# Patient Record
Sex: Female | Born: 1965 | Race: White | Hispanic: No | Marital: Married | Smoking: Current every day smoker
Health system: Southern US, Community
[De-identification: ages and names within clinical notes are randomized; demographics above are authoritative.]

## PROBLEM LIST (undated history)

## (undated) DIAGNOSIS — I10 Essential (primary) hypertension: Secondary | ICD-10-CM

## (undated) HISTORY — PX: GALLBLADDER SURGERY: SHX652

## (undated) HISTORY — DX: Essential (primary) hypertension: I10

---

## 2012-10-27 ENCOUNTER — Ambulatory Visit: Payer: Self-pay | Admitting: Family

## 2013-01-30 ENCOUNTER — Ambulatory Visit: Payer: Self-pay | Admitting: Family

## 2014-08-21 ENCOUNTER — Encounter: Payer: Self-pay | Admitting: Obstetrics and Gynecology

## 2014-08-21 ENCOUNTER — Ambulatory Visit (INDEPENDENT_AMBULATORY_CARE_PROVIDER_SITE_OTHER): Payer: Managed Care, Other (non HMO) | Admitting: Obstetrics and Gynecology

## 2014-08-21 VITALS — BP 126/71 | HR 92 | Ht 64.0 in | Wt 145.3 lb

## 2014-08-21 DIAGNOSIS — Z01419 Encounter for gynecological examination (general) (routine) without abnormal findings: Secondary | ICD-10-CM

## 2014-08-21 DIAGNOSIS — Z720 Tobacco use: Secondary | ICD-10-CM | POA: Diagnosis not present

## 2014-08-21 DIAGNOSIS — N943 Premenstrual tension syndrome: Secondary | ICD-10-CM

## 2014-08-21 DIAGNOSIS — F3281 Premenstrual dysphoric disorder: Secondary | ICD-10-CM

## 2014-08-21 DIAGNOSIS — F17200 Nicotine dependence, unspecified, uncomplicated: Secondary | ICD-10-CM

## 2014-08-21 MED ORDER — FLUOXETINE HCL 10 MG PO CAPS: 10.0000 mg | ORAL_CAPSULE | Freq: Every day | ORAL | Status: DC

## 2014-08-21 MED ORDER — TRIAMCINOLONE ACETONIDE 0.025 % EX OINT: 1.0000 "application " | TOPICAL_OINTMENT | Freq: Two times a day (BID) | CUTANEOUS | Status: DC

## 2014-08-21 NOTE — Progress Notes (Signed)
  Subjective:     Sherri Alvarez is a 49 y.o. female and is here for a comprehensive physical exam. The patient reports spacing out of menses x 6 months, with moderate breast tenderness and severe aggitation the week before.Marland Kitchen  History   Social History  . Marital Status: Married    Spouse Name: N/A  . Number of Children: N/A  . Years of Education: N/A   Occupational History  . Not on file.   Social History Main Topics  . Smoking status: Current Every Day Smoker  . Smokeless tobacco: Never Used  . Alcohol Use: Yes     Comment: occas  . Drug Use: No  . Sexual Activity: Yes     Comment: husband-vasectomy   Other Topics Concern  . Not on file   Social History Narrative  . No narrative on file   Health Maintenance  Topic Date Due  . HIV Screening  10/30/1981  . PAP SMEAR  10/30/1984  . TETANUS/TDAP  10/30/1985  . INFLUENZA VACCINE  08/13/2014    The following portions of the patient's history were reviewed and updated as appropriate: allergies, current medications, past family history, past medical history, past social history, past surgical history and problem list.  Review of Systems A comprehensive review of systems was negative except for: Behavioral/Psych: positive for mood swings, sleep disturbance, tobacco use and premenstrual aggitation   Objective:    General appearance: alert, cooperative and appears stated age Neck: no adenopathy, no carotid bruit, no JVD, supple, symmetrical, trachea midline and thyroid not enlarged, symmetric, no tenderness/mass/nodules Lungs: clear to auscultation bilaterally Breasts: normal appearance, no masses or tenderness Heart: regular rate and rhythm, S1, S2 normal, no murmur, click, rub or gallop Abdomen: soft, non-tender; bowel sounds normal; no masses,  no organomegaly Pelvic: cervix normal in appearance, external genitalia normal, no adnexal masses or tenderness, no cervical motion tenderness, rectovaginal septum normal, uterus  normal size, shape, and consistency and vagina normal without discharge    Assessment:    Healthy female exam. PMDD, smoker- not motivated to quit. Eczema of mons pubis      Plan:  Routine pap and MMG ordered; screening labs ordered will return 08/31/14 for them prozac ordered to take week before menses rx sent in for triamcilone cream for prn use  RTC 1 year or as needed   See After Visit Summary for Counseling Recommendations

## 2014-08-23 ENCOUNTER — Telehealth: Payer: Self-pay | Admitting: Obstetrics and Gynecology

## 2014-08-23 ENCOUNTER — Other Ambulatory Visit: Payer: Self-pay | Admitting: *Deleted

## 2014-08-23 MED ORDER — TRIAMCINOLONE ACETONIDE 0.025 % EX OINT
1.0000 | TOPICAL_OINTMENT | Freq: Two times a day (BID) | CUTANEOUS | Status: DC
Start: 2014-08-23 — End: 2016-10-21

## 2014-08-23 MED ORDER — FLUOXETINE HCL 10 MG PO CAPS: 10.0000 mg | ORAL_CAPSULE | Freq: Every day | ORAL | Status: DC

## 2014-08-23 MED ORDER — LORATADINE-PSEUDOEPHEDRINE ER 5-120 MG PO TB12: 1.0000 | ORAL_TABLET | Freq: Every day | ORAL | Status: DC

## 2014-08-23 NOTE — Telephone Encounter (Signed)
rx did not go to [facility] she was here tuesday

## 2014-08-23 NOTE — Telephone Encounter (Signed)
Medication was sent to [facility] pt told me wrong

## 2014-08-24 ENCOUNTER — Encounter: Payer: Self-pay | Admitting: Obstetrics and Gynecology

## 2014-08-24 ENCOUNTER — Encounter: Payer: Self-pay | Admitting: *Deleted

## 2014-08-24 ENCOUNTER — Telehealth: Payer: Self-pay | Admitting: *Deleted

## 2014-08-24 NOTE — Telephone Encounter (Signed)
-----   Message from Ulyses Amor, [place] sent at 08/24/2014  3:46 PM EDT ----- Please let her know her pap and HPV were both negative

## 2014-08-24 NOTE — Telephone Encounter (Signed)
Notified pt of normal results 

## 2014-09-27 ENCOUNTER — Other Ambulatory Visit: Payer: Self-pay | Admitting: Obstetrics and Gynecology

## 2014-09-27 DIAGNOSIS — R928 Other abnormal and inconclusive findings on diagnostic imaging of breast: Secondary | ICD-10-CM

## 2014-10-29 ENCOUNTER — Ambulatory Visit
Admission: RE | Admit: 2014-10-29 | Discharge: 2014-10-29 | Disposition: A | Discharge status: Home / Self Care | Payer: Managed Care, Other (non HMO) | Source: Ambulatory Visit | Attending: Obstetrics and Gynecology | Admitting: Obstetrics and Gynecology

## 2014-10-29 ENCOUNTER — Other Ambulatory Visit: Payer: Self-pay | Admitting: Obstetrics and Gynecology

## 2014-10-29 DIAGNOSIS — R928 Other abnormal and inconclusive findings on diagnostic imaging of breast: Secondary | ICD-10-CM

## 2014-10-29 DIAGNOSIS — N63 Unspecified lump in breast: Secondary | ICD-10-CM | POA: Diagnosis present

## 2014-11-27 ENCOUNTER — Telehealth: Payer: Self-pay | Admitting: *Deleted

## 2014-11-27 NOTE — Telephone Encounter (Signed)
-----   Message from Ulyses AmorMelody N Burr, [place] sent at 10/30/2014  3:28 PM EDT ----- Please let her know I have reviewed her f/u MMG and u/s, and agree with findings and recommended f/u scan in 6 months. Have her notify us when she recieves letter stating it is time to schedule and i will put in the orders

## 2014-11-27 NOTE — Telephone Encounter (Signed)
Notified pt. 

## 2015-08-22 ENCOUNTER — Encounter: Payer: Managed Care, Other (non HMO) | Admitting: Obstetrics and Gynecology

## 2015-10-23 ENCOUNTER — Encounter: Payer: Managed Care, Other (non HMO) | Admitting: Obstetrics and Gynecology

## 2016-10-21 ENCOUNTER — Encounter: Payer: Self-pay | Admitting: Obstetrics and Gynecology

## 2016-10-21 ENCOUNTER — Ambulatory Visit (INDEPENDENT_AMBULATORY_CARE_PROVIDER_SITE_OTHER): Payer: 59 | Admitting: Obstetrics and Gynecology

## 2016-10-21 VITALS — BP 147/75 | HR 85 | Ht 63.0 in | Wt 156.2 lb

## 2016-10-21 DIAGNOSIS — Z01419 Encounter for gynecological examination (general) (routine) without abnormal findings: Secondary | ICD-10-CM

## 2016-10-21 NOTE — Addendum Note (Signed)
Addended by: Rosine Beat L on: 10/21/2016 04:05 PM   Modules accepted: Orders

## 2016-10-21 NOTE — Progress Notes (Signed)
Subjective:   Sherri Alvarez is a 51 y.o. G2P2 Caucasian female here for a routine well-woman exam.  No LMP recorded.    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 Illicit drugs: no history of illicit drug use  The following portions of the patient's history were reviewed and updated as appropriate: allergies, current medications, past family history, past medical history, past social history, past surgical history and problem list.  Past Medical History Past Medical History:  Diagnosis Date  . Hypertension     Past Surgical History Past Surgical History:  Procedure Laterality Date  . GALLBLADDER SURGERY      Gynecologic History G2P2  No LMP recorded. Contraception: vasectomy Last Pap: 2016. Results were: normal Last mammogram: 2016. Results were: abnormal, with stable f/u films in 6 months  Obstetric History OB History  Gravida Para Term Preterm AB Living  2 2          SAB TAB Ectopic Multiple Live Births               # Outcome Date GA Lbr Len/2nd Weight Sex Delivery Anes PTL Lv  2 Para 2003    F Vag-Spont     1 Para 2001    M Vag-Spont         Current Medications Current Outpatient Prescriptions on File Prior to Visit  Medication Sig Dispense Refill  . FLUoxetine (PROZAC) 10 MG capsule Take 1 capsule (10 mg total) by mouth daily. 30 capsule 3  . loratadine-pseudoephedrine (CLARITIN-D 12-HOUR) 5-120 MG per tablet Take 1 tablet by mouth daily. 30 tablet 3  . triamcinolone (KENALOG) 0.025 % ointment Apply 1 application topically 2 (two) times daily. 30 g 2   No current facility-administered medications on file prior to visit.     Review of Systems Patient denies any headaches, blurred vision, shortness of breath, chest pain, abdominal pain,  problems with bowel movements, urination, or intercourse.  Objective:  BP (!) 147/75   Pulse 85   Ht  (1.6 m)   Wt 156 lb 3.2 oz (70.9 kg)   BMI 27.67 kg/m  Physical Exam  General:  Well developed, well nourished, no acute distress. She is alert and oriented x3. Skin:  Warm and dry Neck:  Midline trachea, no thyromegaly or nodules Cardiovascular: Regular rate and rhythm, no murmur heard Lungs:  Effort normal, all lung fields clear to auscultation bilaterally Breasts:  No dominant palpable mass, retraction, or nipple discharge Abdomen:  Soft, non tender, no hepatosplenomegaly or masses Pelvic:  External genitalia is normal in appearance.  The vagina is normal in appearance. The cervix is bulbous, no CMT.  Thin prep pap is not done . Uterus is felt to be normal size, shape, and contour.  No adnexal masses or tenderness noted. Extremities:  No swelling or varicosities noted Psych:  She has a normal mood and affect  Assessment:   Healthy well-woman exam  Plan:   F/U 1 year for AE, or sooner if needed Mammogram ordered                   Colonoscopy declined  Melody Suzan Nailer, CNM

## 2017-10-28 ENCOUNTER — Encounter: Payer: 59 | Admitting: Obstetrics and Gynecology

## 2018-08-26 ENCOUNTER — Other Ambulatory Visit: Payer: Self-pay | Admitting: Internal Medicine

## 2018-08-26 DIAGNOSIS — N6320 Unspecified lump in the left breast, unspecified quadrant: Secondary | ICD-10-CM

## 2018-09-21 ENCOUNTER — Ambulatory Visit
Admission: RE | Admit: 2018-09-21 | Discharge: 2018-09-21 | Disposition: A | Discharge status: Home / Self Care | Payer: 59 | Source: Ambulatory Visit | Attending: Internal Medicine | Admitting: Internal Medicine

## 2018-09-21 ENCOUNTER — Encounter: Payer: Self-pay | Admitting: Radiology

## 2018-09-21 DIAGNOSIS — N6320 Unspecified lump in the left breast, unspecified quadrant: Secondary | ICD-10-CM | POA: Insufficient documentation

## 2019-06-09 ENCOUNTER — Other Ambulatory Visit: Payer: Self-pay | Admitting: Internal Medicine

## 2019-06-09 DIAGNOSIS — Z1231 Encounter for screening mammogram for malignant neoplasm of breast: Secondary | ICD-10-CM

## 2019-12-13 ENCOUNTER — Ambulatory Visit
Admission: RE | Admit: 2019-12-13 | Discharge: 2019-12-13 | Disposition: A | Discharge status: Home / Self Care | Payer: 59 | Source: Ambulatory Visit | Attending: Internal Medicine | Admitting: Internal Medicine

## 2019-12-13 ENCOUNTER — Other Ambulatory Visit: Payer: Self-pay

## 2019-12-13 DIAGNOSIS — Z1231 Encounter for screening mammogram for malignant neoplasm of breast: Secondary | ICD-10-CM | POA: Insufficient documentation

## 2021-06-18 ENCOUNTER — Other Ambulatory Visit: Payer: Self-pay | Admitting: Internal Medicine

## 2021-06-18 DIAGNOSIS — Z1231 Encounter for screening mammogram for malignant neoplasm of breast: Secondary | ICD-10-CM

## 2021-07-09 IMAGING — MG DIGITAL SCREENING BILAT W/ TOMO W/ CAD
6 of 10 series · 6 of 30 positions shown · non-contrast
Comparison: Previous exam(s).

CLINICAL DATA: Screening.

EXAM:
DIGITAL SCREENING BILATERAL MAMMOGRAM WITH TOMO AND CAD

[R CC synth-2D]
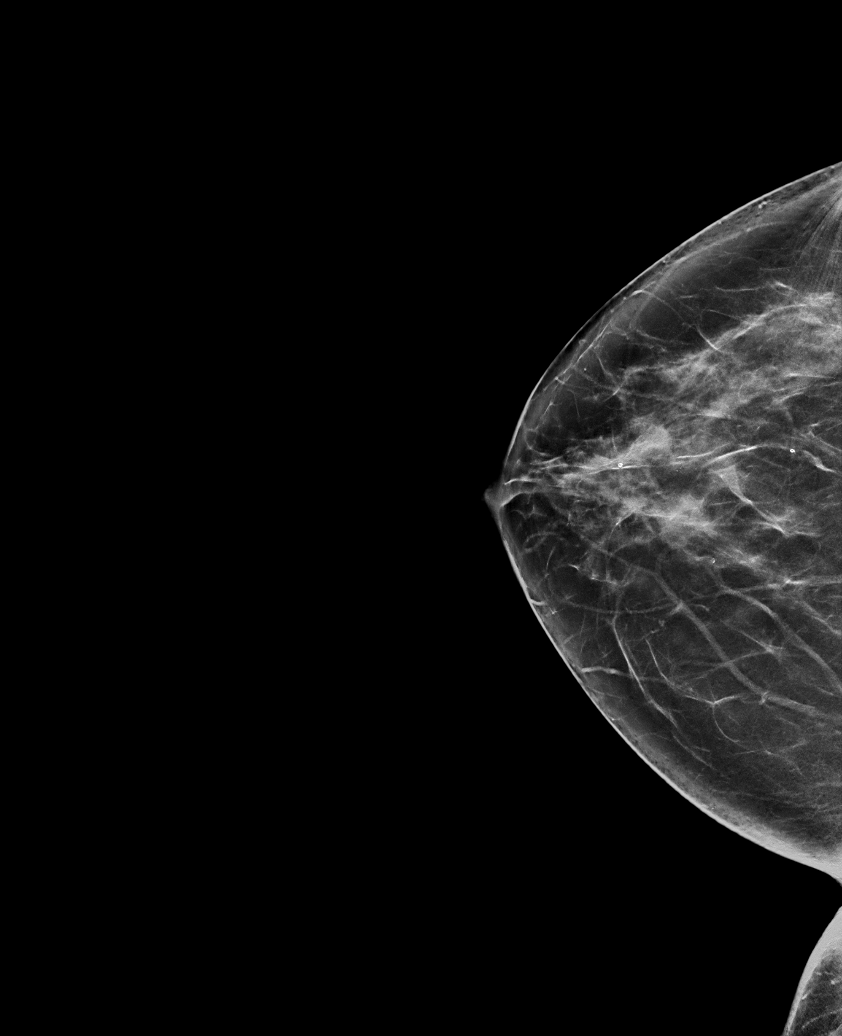

[L XCCL synth-2D]
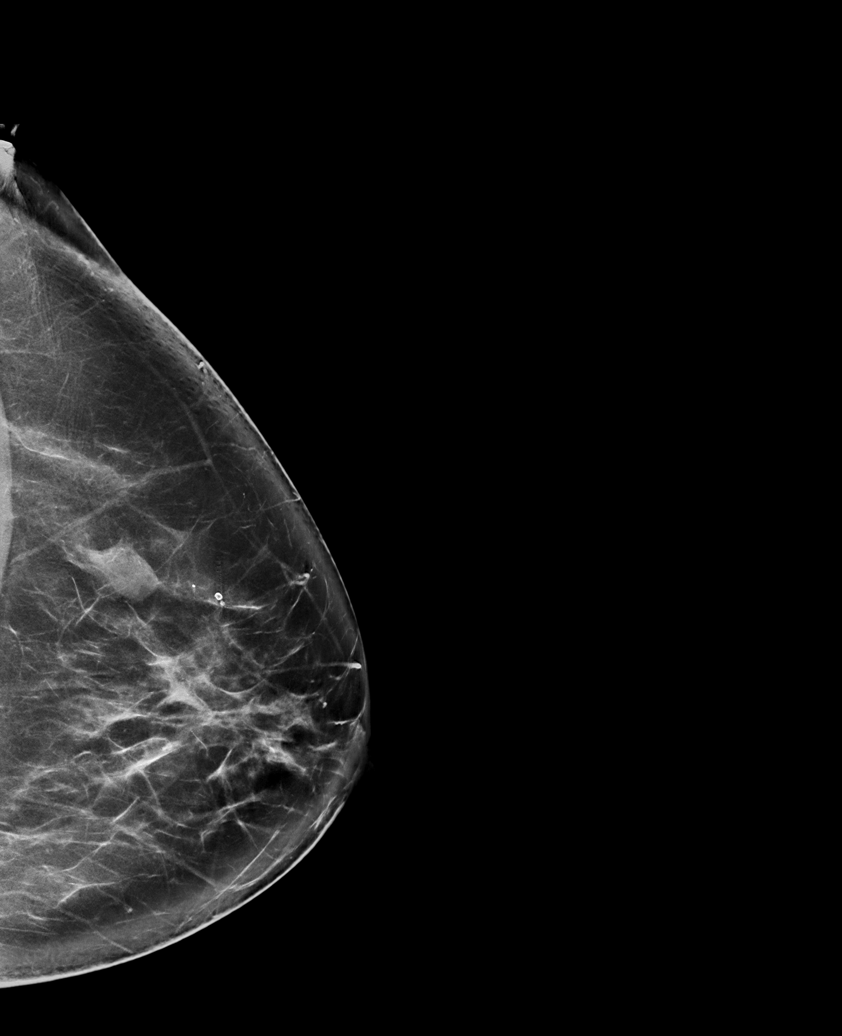

[L CC synth-2D]
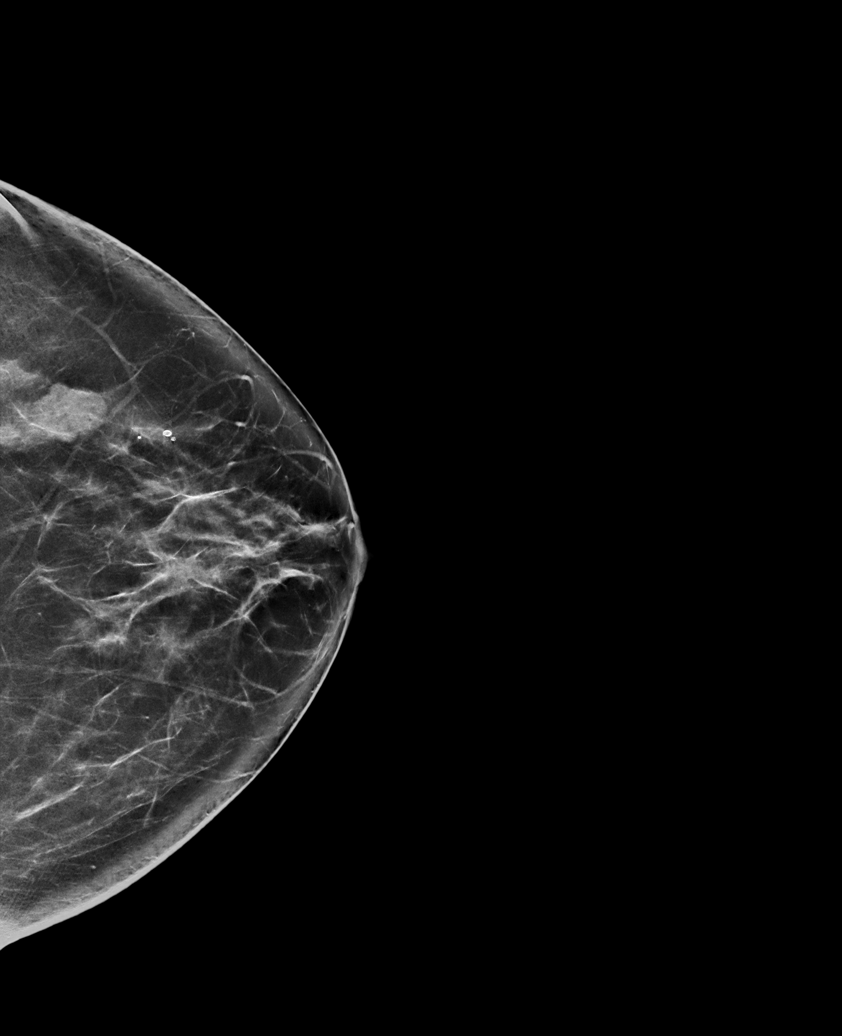

[L MLO synth-2D]
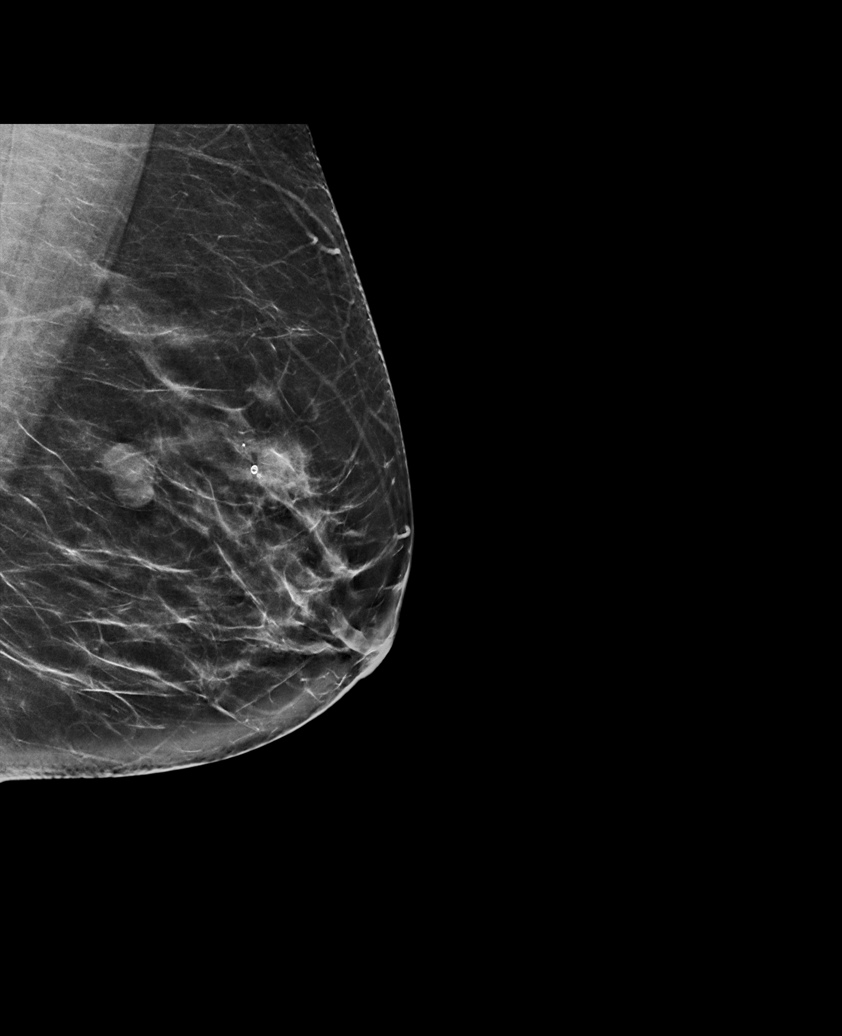

[R MLO synth-2D]
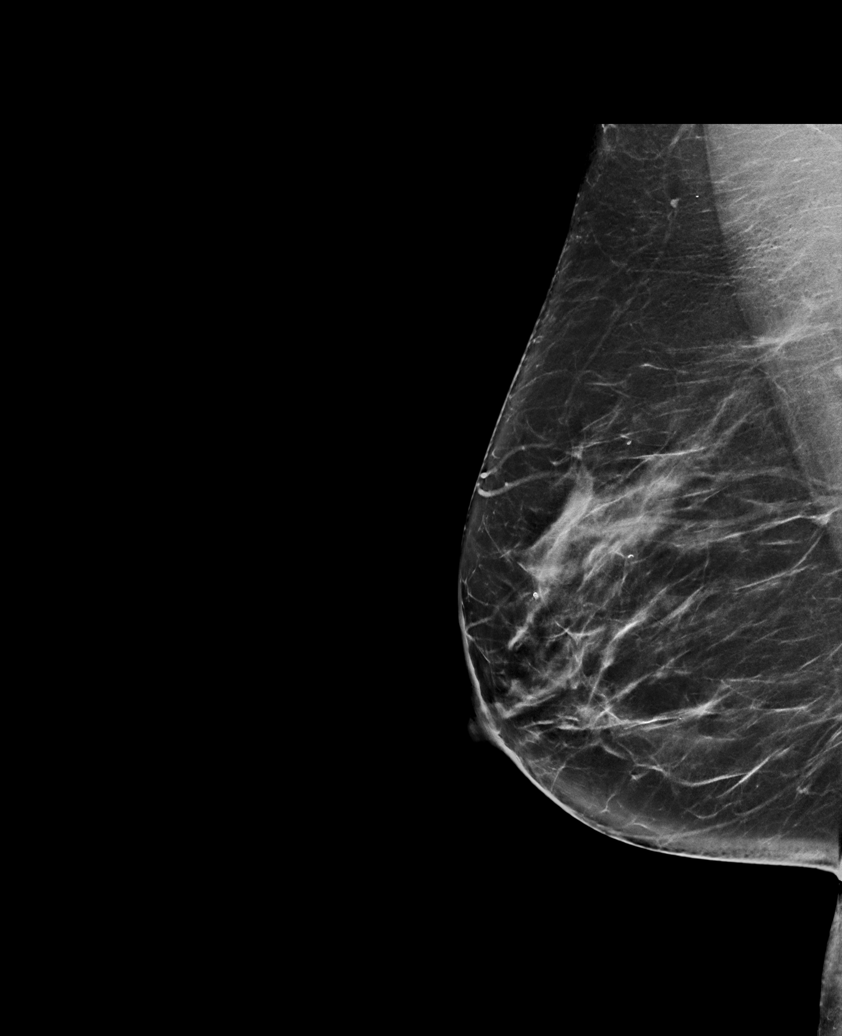

[L XCCL tomo · tomo slice 43/85.0]
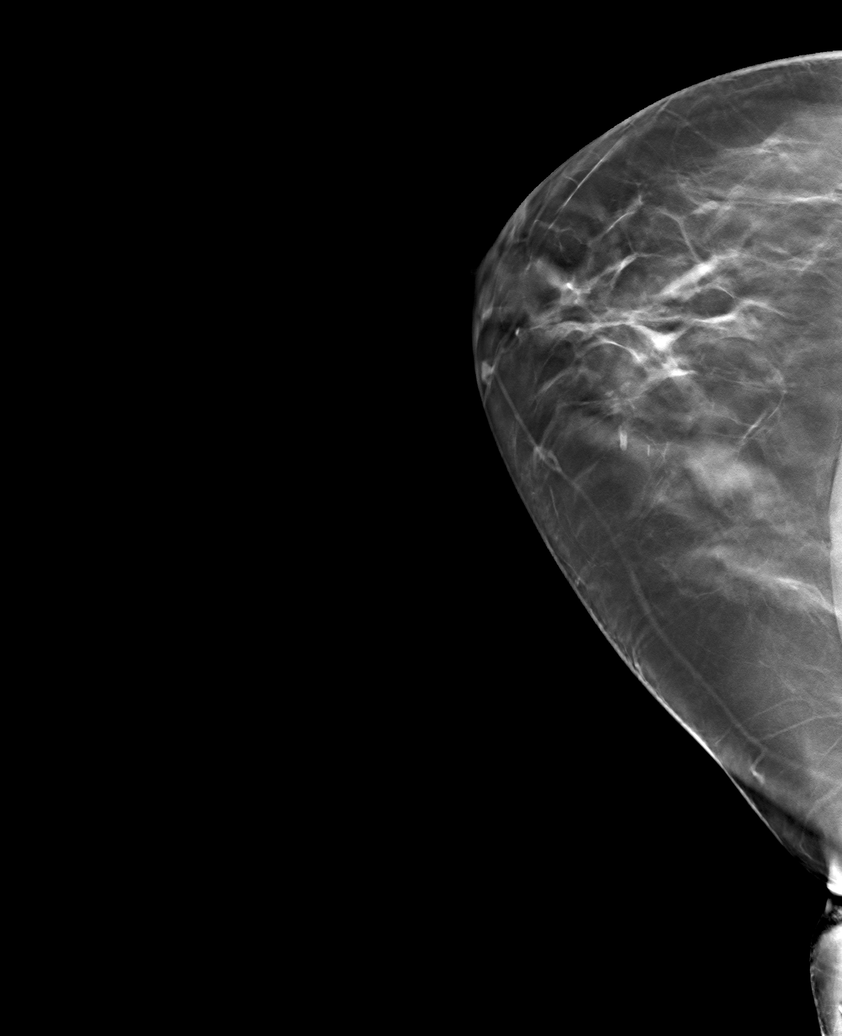

[6 of 30 positions shown; findings below may reference images not displayed]

ACR Breast Density Category c: The breast tissue is heterogeneously
dense, which may obscure small masses.
FINDINGS: There are no findings suspicious for malignancy. Images were
processed with CAD.
IMPRESSION: No mammographic evidence of malignancy. A result letter of this
screening mammogram will be mailed directly to the patient.

RECOMMENDATION:
Screening mammogram in one year. (Code:FT-U-LHB)

BI-RADS CATEGORY  1: Negative.

## 2021-07-22 LAB — COLOGUARD: COLOGUARD: NEGATIVE

## 2021-08-01 ENCOUNTER — Ambulatory Visit
Admission: RE | Admit: 2021-08-01 | Discharge: 2021-08-01 | Disposition: A | Discharge status: Home / Self Care | Payer: 59 | Source: Ambulatory Visit | Attending: Internal Medicine | Admitting: Internal Medicine

## 2021-08-01 DIAGNOSIS — Z1231 Encounter for screening mammogram for malignant neoplasm of breast: Secondary | ICD-10-CM | POA: Insufficient documentation

## 2023-10-21 ENCOUNTER — Other Ambulatory Visit: Payer: Self-pay | Admitting: Internal Medicine

## 2023-10-21 DIAGNOSIS — Z1231 Encounter for screening mammogram for malignant neoplasm of breast: Secondary | ICD-10-CM
